# Patient Record
Sex: Male | Born: 1995 | Race: Black or African American | Hispanic: No | Marital: Single | Smoking: Never smoker
Health system: Southern US, Community
[De-identification: ages and names within clinical notes are randomized; demographics above are authoritative.]

## PROBLEM LIST (undated history)

## (undated) DIAGNOSIS — F909 Attention-deficit hyperactivity disorder, unspecified type: Secondary | ICD-10-CM

## (undated) HISTORY — PX: RECTAL SURGERY: SHX760

---
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
## 2018-01-07 ENCOUNTER — Other Ambulatory Visit: Payer: Self-pay

## 2018-01-07 ENCOUNTER — Emergency Department (HOSPITAL_COMMUNITY)
Admission: EM | Admit: 2018-01-07 | Discharge: 2018-01-07 | Disposition: A | Discharge status: Home / Self Care | Payer: No Typology Code available for payment source | Attending: Emergency Medicine | Admitting: Emergency Medicine

## 2018-01-07 ENCOUNTER — Emergency Department (HOSPITAL_COMMUNITY): Payer: No Typology Code available for payment source

## 2018-01-07 DIAGNOSIS — Y929 Unspecified place or not applicable: Secondary | ICD-10-CM | POA: Diagnosis not present

## 2018-01-07 DIAGNOSIS — S0993XA Unspecified injury of face, initial encounter: Secondary | ICD-10-CM

## 2018-01-07 DIAGNOSIS — R51 Headache: Secondary | ICD-10-CM | POA: Diagnosis not present

## 2018-01-07 DIAGNOSIS — Y939 Activity, unspecified: Secondary | ICD-10-CM | POA: Diagnosis not present

## 2018-01-07 DIAGNOSIS — S0083XA Contusion of other part of head, initial encounter: Secondary | ICD-10-CM | POA: Diagnosis not present

## 2018-01-07 DIAGNOSIS — Y999 Unspecified external cause status: Secondary | ICD-10-CM | POA: Insufficient documentation

## 2018-01-07 DIAGNOSIS — S025XXA Fracture of tooth (traumatic), initial encounter for closed fracture: Secondary | ICD-10-CM | POA: Diagnosis present

## 2018-01-07 MED ORDER — IBUPROFEN 800 MG PO TABS: 800.0000 mg | ORAL_TABLET | Freq: Three times a day (TID) | ORAL | 0 refills | Status: AC | PRN

## 2018-01-07 MED ORDER — TRAMADOL HCL 50 MG PO TABS: 50.0000 mg | ORAL_TABLET | Freq: Four times a day (QID) | ORAL | 0 refills | Status: AC | PRN

## 2018-01-07 NOTE — Discharge Instructions (Signed)
Return to the emergency room for fever, change in vision, redness to the face that rapidly spreads towards the eye, nausea or vomiting, difficulty swallowing or shortness of breath. °  °Apply warm compresses to jaw throughout the day.  ° °Low-cost dental clinic: °**David  [facility]  at [phone]**  ° °You may also call [phone] ° °Dental Assistance °If the dentist on-call cannot see you, please use the resources below: ° ° °Patients with Medicaid: [facility] °[address], [phone] °[address], [phone] ° °If unable to pay, or uninsured, contact [facility] ([phone]) or [facility] ([phone] in , [phone] in [place]) to become qualified for the adult dental clinic ° °Other Low-Cost Community Dental Services: °[facility]- [address], [place], [place], [address] °   [phone], Ext. 123 °   2nd and 4th Thursday of the month at 6:30am °   10 clients each day by appointment, can sometimes see walk-in     patients if someone does not show for an appointment °[facility]- [address], [place], [place], [address] °   [phone] °[facility]- [address], [place], [place], [address] °   [phone] ° °[facility]- [phone] °[facility]- [phone] °[facility]- [phone] ° °
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
## 2019-01-01 ENCOUNTER — Emergency Department (HOSPITAL_COMMUNITY): Payer: Self-pay

## 2019-01-01 ENCOUNTER — Other Ambulatory Visit: Payer: Self-pay

## 2019-01-01 ENCOUNTER — Emergency Department (HOSPITAL_COMMUNITY)
Admission: EM | Admit: 2019-01-01 | Discharge: 2019-01-01 | Disposition: A | Discharge status: Home / Self Care | Payer: Self-pay | Attending: Emergency Medicine | Admitting: Emergency Medicine

## 2019-01-01 ENCOUNTER — Encounter (HOSPITAL_COMMUNITY): Payer: Self-pay | Admitting: Emergency Medicine

## 2019-01-01 DIAGNOSIS — Z79899 Other long term (current) drug therapy: Secondary | ICD-10-CM | POA: Insufficient documentation

## 2019-01-01 DIAGNOSIS — Y999 Unspecified external cause status: Secondary | ICD-10-CM | POA: Insufficient documentation

## 2019-01-01 DIAGNOSIS — Y929 Unspecified place or not applicable: Secondary | ICD-10-CM | POA: Insufficient documentation

## 2019-01-01 DIAGNOSIS — Y9367 Activity, basketball: Secondary | ICD-10-CM | POA: Insufficient documentation

## 2019-01-01 DIAGNOSIS — W502XXA Accidental twist by another person, initial encounter: Secondary | ICD-10-CM | POA: Insufficient documentation

## 2019-01-01 DIAGNOSIS — S93492A Sprain of other ligament of left ankle, initial encounter: Secondary | ICD-10-CM | POA: Insufficient documentation

## 2019-01-01 MED ORDER — ACETAMINOPHEN 325 MG PO TABS
325.0000 mg | ORAL_TABLET | Freq: Once | ORAL | Status: AC
  Administered 2019-01-01: 325 mg via ORAL
  Filled 2019-01-01: qty 1

## 2019-01-01 NOTE — Discharge Instructions (Addendum)
You have been diagnosed today with sprain of the left ankle.  At this time there does not appear to be the presence of an emergent medical condition, however there is always the potential for conditions to change. Please read and follow the below instructions.  Please return to the Emergency Department immediately for any new or worsening symptoms. Please be sure to follow up with your Primary Care Provider within one week regarding your visit today; please call their office to schedule an appointment even if you are feeling better for a follow-up visit. Your x-rays today did not show any fracture or dislocation.  However unseen fractures may be present.  Additionally ligamentous or tendon injury is likely.  Follow-up with the orthopedic specialist Dr. Ginette Pitman for further evaluation of your left ankle pain, call his office today to schedule an appointment.  It is likely that you have sprained your left ankle please use the brace and crutches provided today to avoid further injury.  Use rest ice and elevation to help with your symptoms.  Get help right away if: You cannot feel your toes or foot. Your foot or toes look blue. You have very bad pain that gets worse. Any new/concerning or worsening symptoms  Please read the additional information packets attached to your discharge summary.

## 2019-01-01 NOTE — ED Notes (Signed)
Pt back from X-ray.  

## 2019-01-01 NOTE — ED Triage Notes (Signed)
EMS stated, he hurt his left foot playing basketball 2 days ago. I can't hardly walk on it.

## 2019-01-01 NOTE — ED Notes (Signed)
Pt transported to xray 

## 2019-01-01 NOTE — ED Notes (Signed)
Pt. Stated, its my foot and ankle
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
## 2020-05-20 ENCOUNTER — Other Ambulatory Visit: Payer: Self-pay

## 2020-05-20 ENCOUNTER — Emergency Department (HOSPITAL_COMMUNITY)
Admission: EM | Admit: 2020-05-20 | Discharge: 2020-05-20 | Disposition: A | Discharge status: Home / Self Care | Payer: Self-pay | Attending: Emergency Medicine | Admitting: Emergency Medicine

## 2020-05-20 ENCOUNTER — Encounter (HOSPITAL_COMMUNITY): Payer: Self-pay

## 2020-05-20 DIAGNOSIS — F410 Panic disorder [episodic paroxysmal anxiety] without agoraphobia: Secondary | ICD-10-CM | POA: Insufficient documentation

## 2020-05-20 DIAGNOSIS — R55 Syncope and collapse: Secondary | ICD-10-CM | POA: Insufficient documentation

## 2020-05-20 LAB — I-STAT CHEM 8, ED
BUN: 5 mg/dL — ABNORMAL LOW (ref 6–20)
Calcium, Ion: 1.06 mmol/L — ABNORMAL LOW (ref 1.15–1.40)
Chloride: 104 mmol/L (ref 98–111)
Creatinine, Ser: 0.7 mg/dL (ref 0.61–1.24)
Glucose, Bld: 82 mg/dL (ref 70–99)
HCT: 44 % (ref 39.0–52.0)
Hemoglobin: 15 g/dL (ref 13.0–17.0)
Potassium: 3.9 mmol/L (ref 3.5–5.1)
Sodium: 140 mmol/L (ref 135–145)
TCO2: 25 mmol/L (ref 22–32)

## 2020-05-20 MED ORDER — HYDROXYZINE HCL 25 MG PO TABS: 25.0000 mg | ORAL_TABLET | Freq: Four times a day (QID) | ORAL | 0 refills | Status: AC

## 2020-05-20 MED ORDER — HYDROXYZINE HCL 25 MG PO TABS: 25.0000 mg | ORAL_TABLET | Freq: Four times a day (QID) | ORAL | 0 refills | Status: DC

## 2020-05-20 NOTE — Discharge Instructions (Signed)
Prescription given for Hydroxyzine. Take medication as directed and do not operate machinery, drive a car, or work while taking this medication as it can make you drowsy.   Please follow up with your primary care provider within 5-7 days for re-evaluation of your symptoms. If you do not have a primary care provider, information for a healthcare clinic has been provided for you to make arrangements for follow up care. Please return to the emergency department for any new or worsening symptoms.   
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
## 2020-05-20 NOTE — ED Notes (Signed)
Called pt for vitals no answer 
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
## 2020-06-03 ENCOUNTER — Emergency Department (HOSPITAL_COMMUNITY)
Admission: EM | Admit: 2020-06-03 | Discharge: 2020-06-03 | Attending: Emergency Medicine | Admitting: Emergency Medicine

## 2020-06-03 ENCOUNTER — Encounter (HOSPITAL_COMMUNITY): Payer: Self-pay

## 2020-06-03 ENCOUNTER — Emergency Department (HOSPITAL_COMMUNITY)

## 2020-06-03 ENCOUNTER — Other Ambulatory Visit: Payer: Self-pay

## 2020-06-03 DIAGNOSIS — R569 Unspecified convulsions: Secondary | ICD-10-CM | POA: Insufficient documentation

## 2020-06-03 DIAGNOSIS — F909 Attention-deficit hyperactivity disorder, unspecified type: Secondary | ICD-10-CM | POA: Insufficient documentation

## 2020-06-03 LAB — CBC WITH DIFFERENTIAL/PLATELET
Abs Immature Granulocytes: 0.02 10*3/uL (ref 0.00–0.07)
Basophils Absolute: 0 10*3/uL (ref 0.0–0.1)
Basophils Relative: 1 %
Eosinophils Absolute: 0.1 10*3/uL (ref 0.0–0.5)
Eosinophils Relative: 1 %
HCT: 45 % (ref 39.0–52.0)
Hemoglobin: 15 g/dL (ref 13.0–17.0)
Immature Granulocytes: 0 %
Lymphocytes Relative: 24 %
Lymphs Abs: 1.8 10*3/uL (ref 0.7–4.0)
MCH: 29.4 pg (ref 26.0–34.0)
MCHC: 33.3 g/dL (ref 30.0–36.0)
MCV: 88.1 fL (ref 80.0–100.0)
Monocytes Absolute: 0.6 10*3/uL (ref 0.1–1.0)
Monocytes Relative: 8 %
Neutro Abs: 4.9 10*3/uL (ref 1.7–7.7)
Neutrophils Relative %: 66 %
Platelets: 174 10*3/uL (ref 150–400)
RBC: 5.11 MIL/uL (ref 4.22–5.81)
RDW: 13.1 % (ref 11.5–15.5)
WBC: 7.5 10*3/uL (ref 4.0–10.5)
nRBC: 0 % (ref 0.0–0.2)

## 2020-06-03 LAB — BASIC METABOLIC PANEL WITH GFR
Anion gap: 11 (ref 5–15)
BUN: 10 mg/dL (ref 6–20)
CO2: 26 mmol/L (ref 22–32)
Calcium: 9.4 mg/dL (ref 8.9–10.3)
Chloride: 102 mmol/L (ref 98–111)
Creatinine, Ser: 0.88 mg/dL (ref 0.61–1.24)
GFR, Estimated: >60 mL/min
Glucose, Bld: 103 mg/dL — ABNORMAL HIGH (ref 70–99)
Potassium: 3.6 mmol/L (ref 3.5–5.1)
Sodium: 139 mmol/L (ref 135–145)

## 2020-06-03 LAB — ETHANOL: Alcohol, Ethyl (B): <10 mg/dL

## 2020-06-03 LAB — MAGNESIUM: Magnesium: 1.6 mg/dL — ABNORMAL LOW (ref 1.7–2.4)

## 2020-06-03 LAB — CBG MONITORING, ED: Glucose-Capillary: 100 mg/dL — ABNORMAL HIGH (ref 70–99)

## 2020-06-03 MED ORDER — LEVETIRACETAM 500 MG PO TABS: 500.0000 mg | ORAL_TABLET | Freq: Two times a day (BID) | ORAL | 0 refills | Status: AC

## 2020-06-03 MED ORDER — LEVETIRACETAM 500 MG PO TABS
1000.0000 mg | ORAL_TABLET | Freq: Once | ORAL | Status: AC
  Administered 2020-06-03: 18:00:00 1000 mg via ORAL
  Filled 2020-06-03: qty 2

## 2020-06-03 MED ORDER — LORAZEPAM 0.5 MG PO TABS
0.5000 mg | ORAL_TABLET | Freq: Once | ORAL | Status: AC
  Administered 2020-06-03: 15:00:00 0.5 mg via ORAL
  Filled 2020-06-03: qty 1

## 2020-06-03 NOTE — ED Triage Notes (Signed)
Per EMS- patient from jail-history of seizures-told staff he took his meds this am but informed EMS he had not-per EMS seizure activity appears to be behavioral

## 2020-06-03 NOTE — Discharge Instructions (Signed)
Get help right away if: You have a seizure that: Lasts longer than 5 minutes. Is different than previous seizures. Leaves you unable to speak or use a part of your body. Makes it harder to breathe. You have: A seizure after a head injury. Multiple seizures in a row. Confusion or a severe headache right after a seizure. You do not wake up immediately after a seizure. You injure yourself during a seizure.
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
## 2022-12-18 IMAGING — CT CT HEAD W/O CM
3 series · 15 of 47 positions shown, 18 images · non-contrast
Comparison: Head CT dated 01/07/2018.

CLINICAL DATA: 24-year-old male with seizure.

EXAM:
CT HEAD WITHOUT CONTRAST
TECHNIQUE: Contiguous axial images were obtained from the base of the skull
through the vertex without intravenous contrast.

[Series 2: head wo · axial · 0.47mm/px · z∈[-163,-38]mm · 9 of 31 slices shown, 12 images]
[im 3/31  brain]
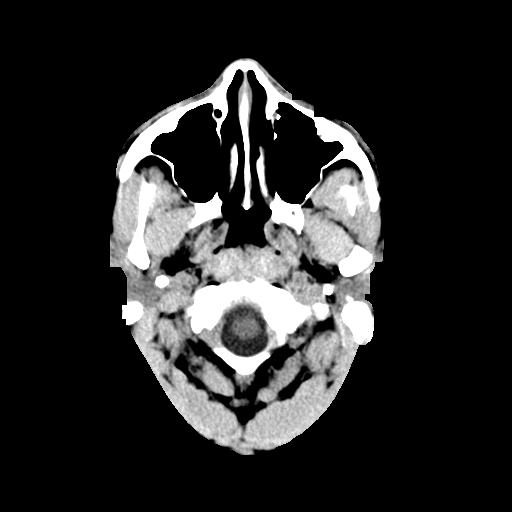
[im 3/31  bone]
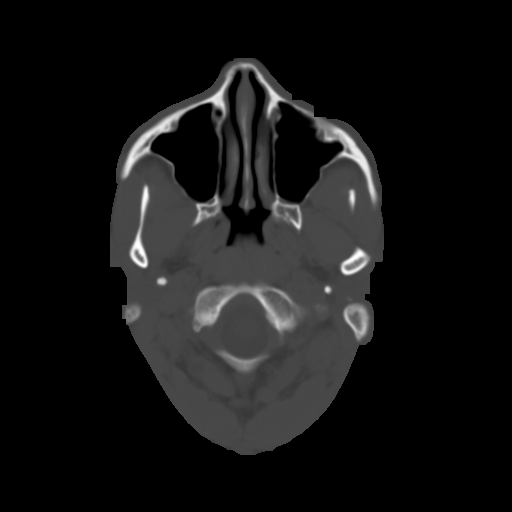
[im 6/31  brain]
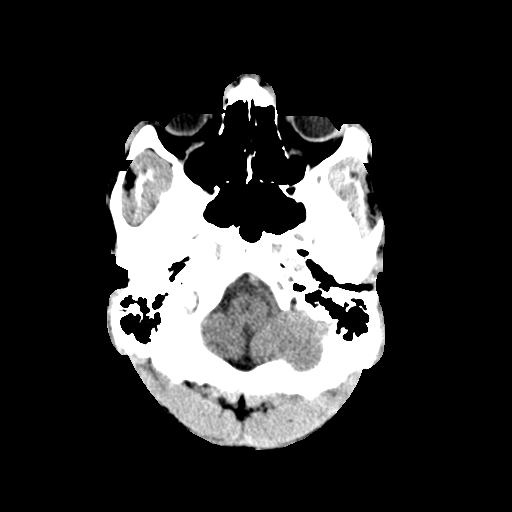
[im 9/31  brain]
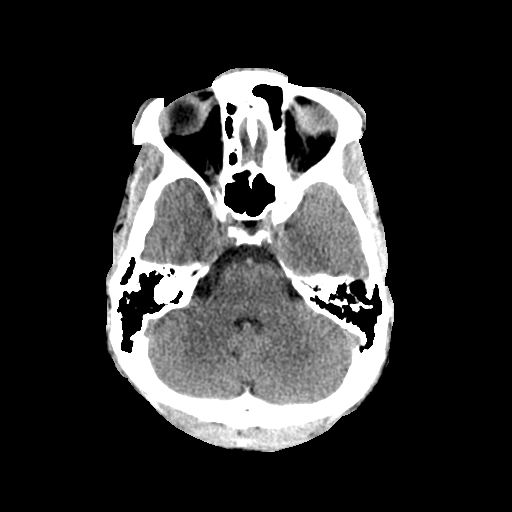
[im 12/31  brain]
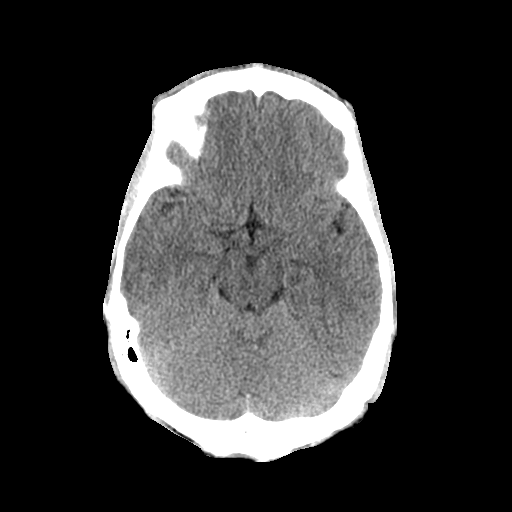
[im 16/31  brain]
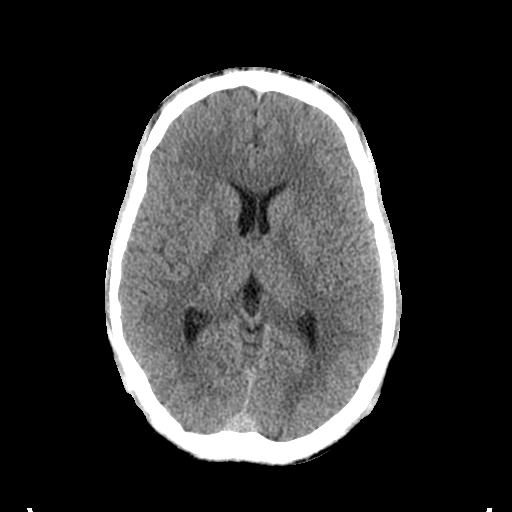
[im 16/31  bone]
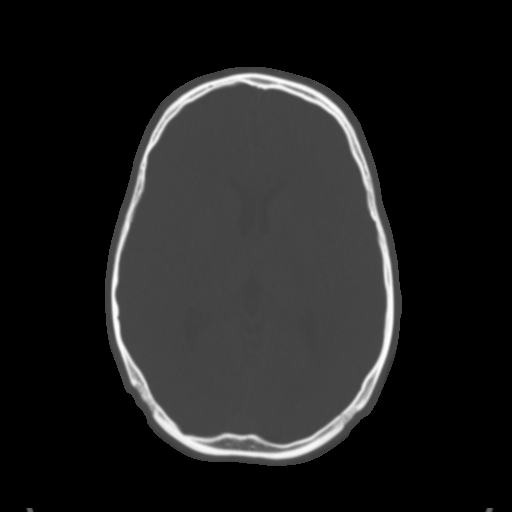
[im 19/31  brain]
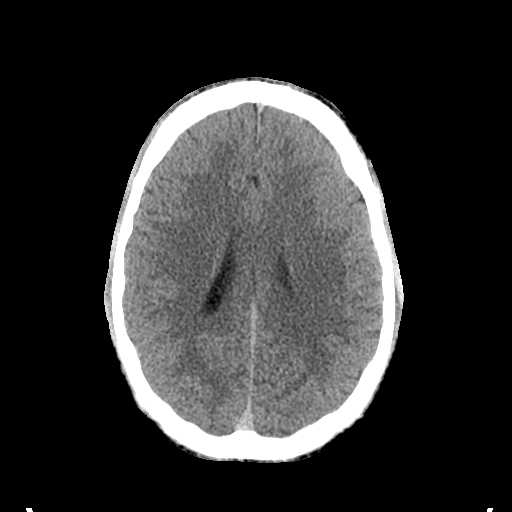
[im 22/31  brain]
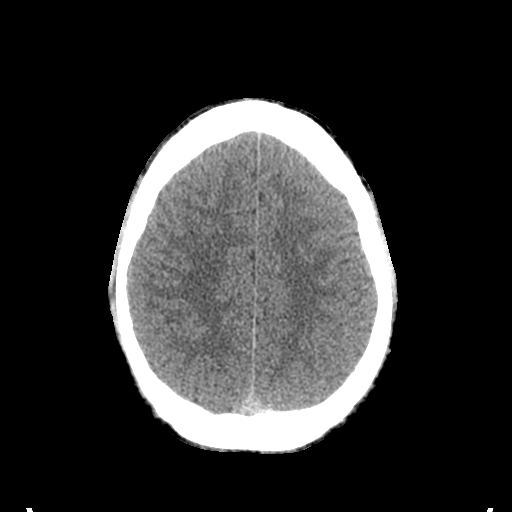
[im 25/31  brain]
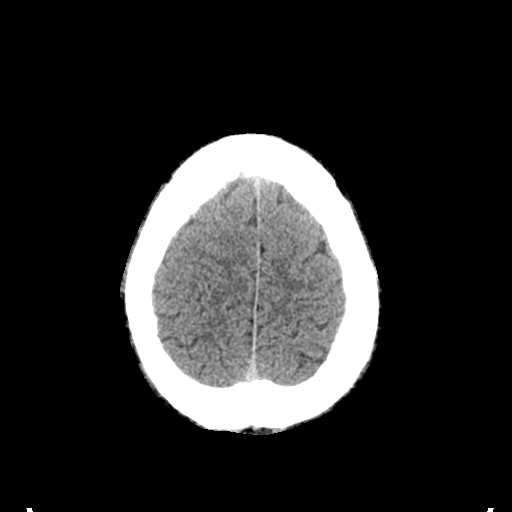
[im 28/31  brain]
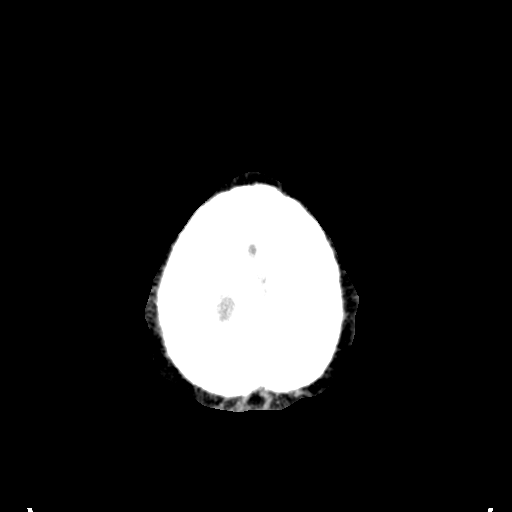
[im 28/31  bone]
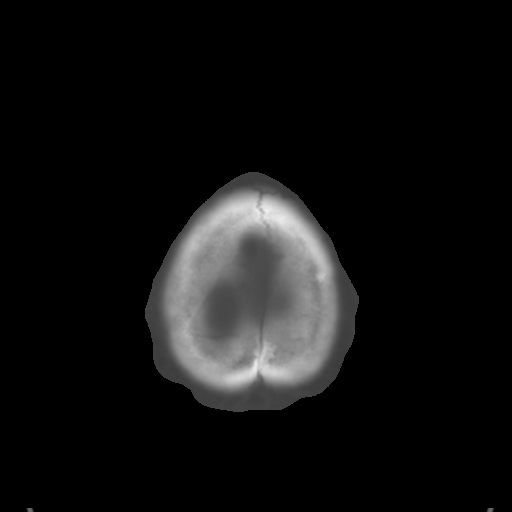

[Series 5: coronal soft tissue · coronal · 0.37mm/px · 3 of 68 slices shown]
[im 23/68  brain]
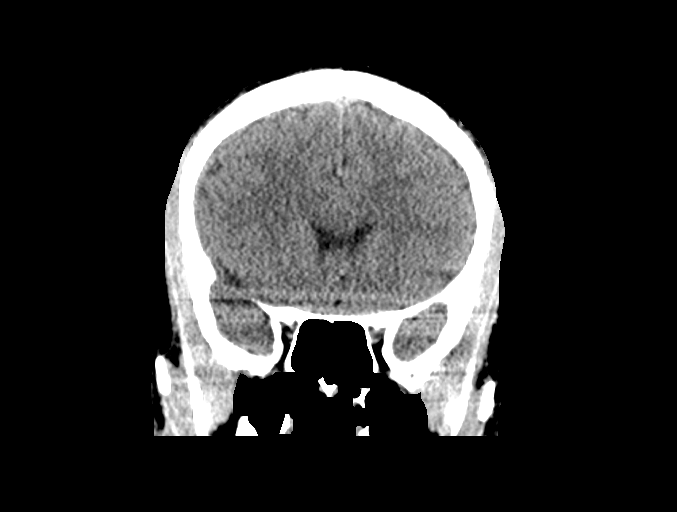
[im 30/68  brain]
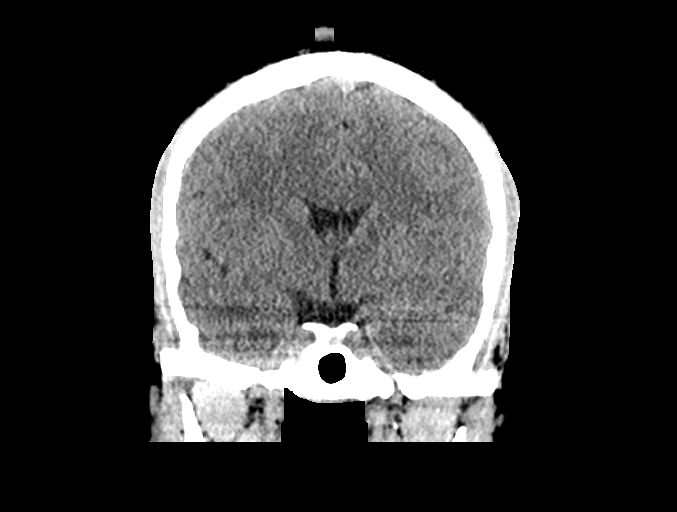
[im 38/68  brain]
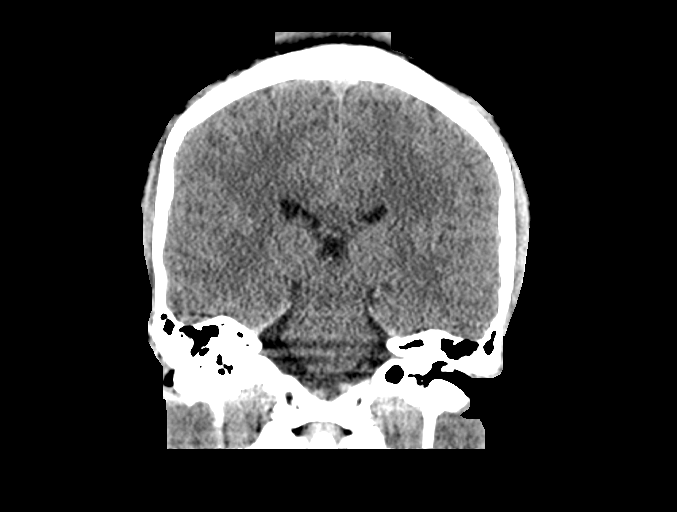

[Series 6: sagittal soft tissue · sagittal · 0.35mm/px · 3 of 54 slices shown]
[im 18/54  brain]
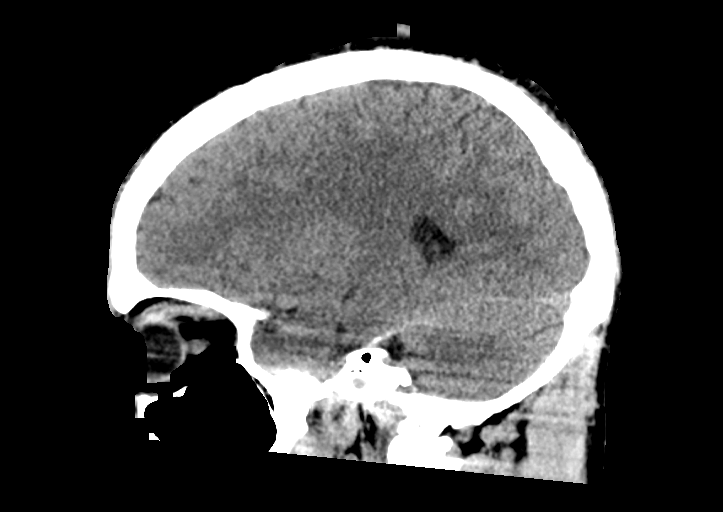
[im 27/54  brain]
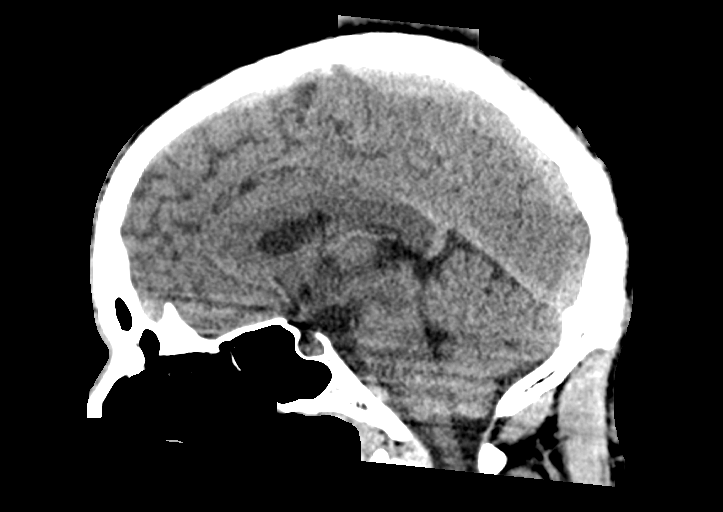
[im 36/54  brain]
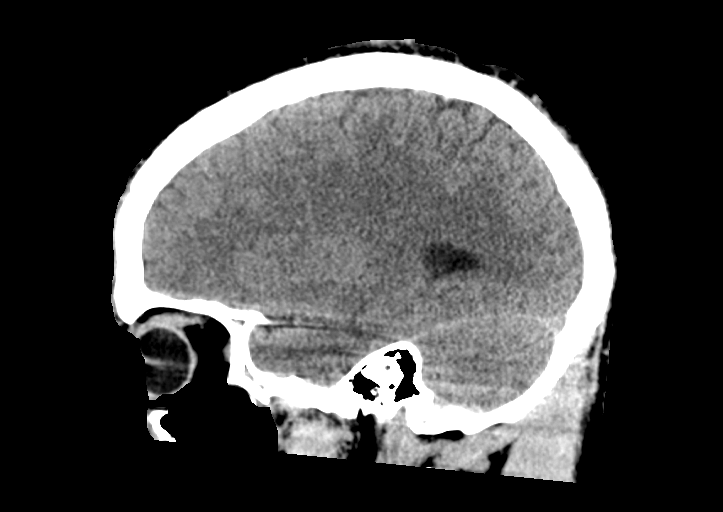

[15 of 47 positions shown; findings below may reference images not displayed]

FINDINGS: Brain: No evidence of acute infarction, hemorrhage, hydrocephalus,
extra-axial collection or mass lesion/mass effect.

Vascular: No hyperdense vessel or unexpected calcification.

Skull: Normal. Negative for fracture or focal lesion.

Sinuses/Orbits: No acute finding.

Other: None
IMPRESSION: Normal noncontrast CT of the brain.

The
